# Patient Record
Sex: Female | Born: 1935 | Hispanic: No | Marital: Married
Health system: Southern US, Community
[De-identification: ages and names within clinical notes are randomized; demographics above are authoritative.]

---

## 2004-10-09 ENCOUNTER — Ambulatory Visit: Payer: Self-pay | Admitting: Family Medicine

## 2006-09-13 ENCOUNTER — Ambulatory Visit: Payer: Self-pay | Admitting: Family Medicine

## 2007-03-02 ENCOUNTER — Ambulatory Visit: Payer: Self-pay | Admitting: Urology

## 2007-03-21 ENCOUNTER — Ambulatory Visit: Payer: Self-pay | Admitting: Urology

## 2007-03-21 ENCOUNTER — Other Ambulatory Visit: Payer: Self-pay

## 2007-03-28 ENCOUNTER — Ambulatory Visit: Payer: Self-pay | Admitting: Urology

## 2008-05-29 ENCOUNTER — Ambulatory Visit: Payer: Self-pay | Admitting: Internal Medicine

## 2009-02-18 ENCOUNTER — Inpatient Hospital Stay: Payer: Self-pay | Admitting: Internal Medicine

## 2009-03-17 ENCOUNTER — Ambulatory Visit: Payer: Self-pay | Admitting: Oncology

## 2009-04-17 LAB — MORPHOLOGY - CHCC SATELLITE
PLT EST ~~LOC~~: ADEQUATE
RBC Comments: NORMAL

## 2009-04-17 LAB — CMP (CANCER CENTER ONLY)
ALT(SGPT): 12 U/L (ref 10–47)
Albumin: 3.6 g/dL (ref 3.3–5.5)
CO2: 26 mEq/L (ref 18–33)
Glucose, Bld: 150 mg/dL — ABNORMAL HIGH (ref 73–118)
Potassium: 4 mEq/L (ref 3.3–4.7)
Sodium: 148 mEq/L — ABNORMAL HIGH (ref 128–145)
Total Bilirubin: 0.4 mg/dl (ref 0.20–1.60)
Total Protein: 7.7 g/dL (ref 6.4–8.1)

## 2009-04-17 LAB — CBC WITH DIFFERENTIAL (CANCER CENTER ONLY)
BASO#: 0.2 10*3/uL (ref 0.0–0.2)
EOS%: 5.5 % (ref 0.0–7.0)
Eosinophils Absolute: 0.9 10*3/uL — ABNORMAL HIGH (ref 0.0–0.5)
HGB: 13.5 g/dL (ref 11.6–15.9)
LYMPH%: 37.7 % (ref 14.0–48.0)
MCH: 30.7 pg (ref 26.0–34.0)
MCHC: 34.7 g/dL (ref 32.0–36.0)
MCV: 88 fL (ref 81–101)
MONO%: 5.2 % (ref 0.0–13.0)
NEUT#: 8.4 10*3/uL — ABNORMAL HIGH (ref 1.5–6.5)
RBC: 4.38 10*6/uL (ref 3.70–5.32)

## 2009-04-18 LAB — URINALYSIS, MICROSCOPIC (CHCC SATELLITE)
Bilirubin (Urine): NEGATIVE
Glucose: NEGATIVE g/dL
Ketones: NEGATIVE mg/dL
Specific Gravity, Urine: 1.015 (ref 1.003–1.035)

## 2009-04-24 LAB — PROTEIN ELECTROPHORESIS, SERUM
Alpha-1-Globulin: 4.7 % (ref 2.9–4.9)
Beta 2: 6.3 % (ref 3.2–6.5)
Beta Globulin: 6.2 % (ref 4.7–7.2)
Gamma Globulin: 13.3 % (ref 11.1–18.8)

## 2009-04-24 LAB — FERRITIN: Ferritin: 14 ng/mL (ref 10–291)

## 2009-04-24 LAB — IRON AND TIBC
%SAT: 15 % — ABNORMAL LOW (ref 20–55)
Iron: 48 ug/dL (ref 42–145)

## 2009-05-09 ENCOUNTER — Ambulatory Visit: Payer: Self-pay | Admitting: Oncology

## 2009-05-13 LAB — URINALYSIS, MICROSCOPIC (CHCC SATELLITE)
Protein: NEGATIVE mg/dL
pH: 8 (ref 4.60–8.00)

## 2009-05-13 LAB — CBC WITH DIFFERENTIAL (CANCER CENTER ONLY)
BASO%: 1.2 % (ref 0.0–2.0)
EOS%: 4.9 % (ref 0.0–7.0)
LYMPH%: 45.6 % (ref 14.0–48.0)
MCH: 29.9 pg (ref 26.0–34.0)
MCHC: 34.1 g/dL (ref 32.0–36.0)
MCV: 88 fL (ref 81–101)
MONO%: 3.6 % (ref 0.0–13.0)
NEUT#: 6.2 10*3/uL (ref 1.5–6.5)
Platelets: 473 10*3/uL — ABNORMAL HIGH (ref 145–400)
RBC: 4.31 10*6/uL (ref 3.70–5.32)
RDW: 11.5 % (ref 10.5–14.6)

## 2009-06-02 ENCOUNTER — Ambulatory Visit: Payer: Self-pay | Admitting: Internal Medicine

## 2009-08-20 ENCOUNTER — Ambulatory Visit: Payer: Self-pay | Admitting: Oncology

## 2009-08-21 LAB — CBC WITH DIFFERENTIAL (CANCER CENTER ONLY)
BASO#: 0.2 10*3/uL (ref 0.0–0.2)
EOS%: 4.6 % (ref 0.0–7.0)
Eosinophils Absolute: 0.6 10*3/uL — ABNORMAL HIGH (ref 0.0–0.5)
HCT: 42.3 % (ref 34.8–46.6)
HGB: 14.1 g/dL (ref 11.6–15.9)
LYMPH#: 6 10*3/uL — ABNORMAL HIGH (ref 0.9–3.3)
MCH: 30.6 pg (ref 26.0–34.0)
MCHC: 33.2 g/dL (ref 32.0–36.0)
MONO%: 3.5 % (ref 0.0–13.0)
NEUT%: 46 % (ref 39.6–80.0)
RBC: 4.6 10*6/uL (ref 3.70–5.32)

## 2009-08-21 LAB — IRON AND TIBC
Iron: 110 ug/dL (ref 42–145)
UIBC: 199 ug/dL

## 2009-08-21 LAB — FERRITIN: Ferritin: 34 ng/mL (ref 10–291)

## 2009-11-28 ENCOUNTER — Emergency Department: Payer: Self-pay | Admitting: Emergency Medicine

## 2010-07-16 ENCOUNTER — Emergency Department: Payer: Self-pay | Admitting: Emergency Medicine

## 2010-07-27 ENCOUNTER — Emergency Department: Payer: Self-pay | Admitting: Emergency Medicine

## 2010-09-11 ENCOUNTER — Ambulatory Visit: Payer: Self-pay | Admitting: Oncology

## 2010-09-23 LAB — CBC WITH DIFFERENTIAL (CANCER CENTER ONLY)
BASO%: 1.1 % (ref 0.0–2.0)
EOS%: 4.6 % (ref 0.0–7.0)
LYMPH%: 38.2 % (ref 14.0–48.0)
MCHC: 34.4 g/dL (ref 32.0–36.0)
MCV: 93 fL (ref 81–101)
MONO#: 0.6 10*3/uL (ref 0.1–0.9)
NEUT%: 51.3 % (ref 39.6–80.0)
Platelets: 408 10*3/uL — ABNORMAL HIGH (ref 145–400)
RDW: 12.1 % (ref 10.5–14.6)
WBC: 12.4 10*3/uL — ABNORMAL HIGH (ref 3.9–10.0)

## 2010-09-23 LAB — CMP (CANCER CENTER ONLY)
Albumin: 4 g/dL (ref 3.3–5.5)
BUN, Bld: 15 mg/dL (ref 7–22)
CO2: 29 mEq/L (ref 18–33)
Calcium: 10 mg/dL (ref 8.0–10.3)
Chloride: 101 mEq/L (ref 98–108)
Glucose, Bld: 124 mg/dL — ABNORMAL HIGH (ref 73–118)
Potassium: 4.4 mEq/L (ref 3.3–4.7)
Sodium: 140 mEq/L (ref 128–145)
Total Protein: 7.2 g/dL (ref 6.4–8.1)

## 2010-09-23 LAB — IRON AND TIBC: TIBC: 293 ug/dL (ref 250–470)

## 2010-09-23 LAB — FERRITIN: Ferritin: 62 ng/mL (ref 10–291)

## 2011-01-20 ENCOUNTER — Ambulatory Visit: Payer: Self-pay | Admitting: Internal Medicine

## 2011-02-14 ENCOUNTER — Ambulatory Visit: Payer: Self-pay | Admitting: Internal Medicine

## 2011-11-08 ENCOUNTER — Emergency Department: Payer: Self-pay | Admitting: Unknown Physician Specialty

## 2012-01-24 ENCOUNTER — Ambulatory Visit: Payer: Self-pay | Admitting: Internal Medicine

## 2012-01-24 LAB — CBC CANCER CENTER
Basophil %: 0.4 %
Eosinophil %: 5.2 %
HCT: 40.7 % (ref 35.0–47.0)
Lymphocyte %: 42.6 %
MCHC: 34.5 g/dL (ref 32.0–36.0)
Monocyte %: 5.3 %
Neutrophil #: 6.7 x10 3/mm — ABNORMAL HIGH (ref 1.4–6.5)
RBC: 4.31 10*6/uL (ref 3.80–5.20)
WBC: 14.4 x10 3/mm — ABNORMAL HIGH (ref 3.6–11.0)

## 2012-02-14 ENCOUNTER — Ambulatory Visit: Payer: Self-pay | Admitting: Internal Medicine

## 2012-04-03 ENCOUNTER — Inpatient Hospital Stay: Payer: Self-pay | Admitting: Internal Medicine

## 2012-04-03 LAB — CBC WITH DIFFERENTIAL/PLATELET
Basophil #: 0 10*3/uL (ref 0.0–0.1)
Basophil %: 0.3 %
Eosinophil #: 0.1 10*3/uL (ref 0.0–0.7)
HCT: 39 % (ref 35.0–47.0)
HGB: 12.9 g/dL (ref 12.0–16.0)
Lymphocyte %: 13.8 %
MCH: 31.4 pg (ref 26.0–34.0)
MCHC: 33.1 g/dL (ref 32.0–36.0)
MCV: 95 fL (ref 80–100)
Monocyte #: 1.2 x10 3/mm — ABNORMAL HIGH (ref 0.2–0.9)
Monocyte %: 6.5 %
Neutrophil %: 79 %
Platelet: 458 10*3/uL — ABNORMAL HIGH (ref 150–440)
RBC: 4.11 10*6/uL (ref 3.80–5.20)
RDW: 13.2 % (ref 11.5–14.5)
WBC: 18.4 10*3/uL — ABNORMAL HIGH (ref 3.6–11.0)

## 2012-04-03 LAB — BASIC METABOLIC PANEL
BUN: 15 mg/dL (ref 7–18)
Calcium, Total: 9.3 mg/dL (ref 8.5–10.1)
Chloride: 103 mmol/L (ref 98–107)
EGFR (African American): >60
EGFR (Non-African Amer.): >60
Osmolality: 282 (ref 275–301)

## 2012-04-05 LAB — CBC WITH DIFFERENTIAL/PLATELET
Basophil #: 0.1 10*3/uL (ref 0.0–0.1)
Basophil %: 0.4 %
Eosinophil #: 0.4 10*3/uL (ref 0.0–0.7)
Eosinophil %: 2.7 %
HCT: 37 % (ref 35.0–47.0)
Lymphocyte %: 24.4 %
MCHC: 33.2 g/dL (ref 32.0–36.0)
MCV: 96 fL (ref 80–100)
Monocyte #: 1.1 x10 3/mm — ABNORMAL HIGH (ref 0.2–0.9)
Monocyte %: 6.5 %
Neutrophil #: 10.9 10*3/uL — ABNORMAL HIGH (ref 1.4–6.5)
Platelet: 441 10*3/uL — ABNORMAL HIGH (ref 150–440)
RBC: 3.84 10*6/uL (ref 3.80–5.20)
RDW: 13.2 % (ref 11.5–14.5)

## 2012-04-05 LAB — BASIC METABOLIC PANEL
Anion Gap: 7 (ref 7–16)
Calcium, Total: 9 mg/dL (ref 8.5–10.1)
Chloride: 103 mmol/L (ref 98–107)
Co2: 32 mmol/L (ref 21–32)
EGFR (Non-African Amer.): >60
Sodium: 142 mmol/L (ref 136–145)

## 2012-09-19 ENCOUNTER — Ambulatory Visit: Payer: Self-pay | Admitting: Internal Medicine

## 2012-11-17 ENCOUNTER — Ambulatory Visit: Payer: Self-pay | Admitting: Internal Medicine

## 2013-01-13 ENCOUNTER — Ambulatory Visit: Payer: Self-pay | Admitting: Internal Medicine

## 2013-01-22 LAB — CBC CANCER CENTER
Basophil #: 0.1 x10 3/mm (ref 0.0–0.1)
Eosinophil #: 0.6 x10 3/mm (ref 0.0–0.7)
Eosinophil %: 4.5 %
HCT: 41.2 % (ref 35.0–47.0)
HGB: 13.7 g/dL (ref 12.0–16.0)
Lymphocyte #: 5.4 x10 3/mm — ABNORMAL HIGH (ref 1.0–3.6)
Lymphocyte %: 40.1 %
MCH: 31.2 pg (ref 26.0–34.0)
MCHC: 33.3 g/dL (ref 32.0–36.0)
Monocyte #: 0.8 x10 3/mm (ref 0.2–0.9)
Monocyte %: 6.2 %
Neutrophil #: 6.5 x10 3/mm (ref 1.4–6.5)
Neutrophil %: 48.3 %
RBC: 4.39 10*6/uL (ref 3.80–5.20)

## 2013-02-13 ENCOUNTER — Ambulatory Visit: Payer: Self-pay | Admitting: Internal Medicine

## 2014-01-08 ENCOUNTER — Inpatient Hospital Stay: Payer: Self-pay | Admitting: Internal Medicine

## 2014-01-08 LAB — CBC
HCT: 42.1 % (ref 35.0–47.0)
HGB: 14.2 g/dL (ref 12.0–16.0)
MCH: 31.2 pg (ref 26.0–34.0)
MCHC: 33.6 g/dL (ref 32.0–36.0)
MCV: 93 fL (ref 80–100)
Platelet: 314 10*3/uL (ref 150–440)
RBC: 4.53 10*6/uL (ref 3.80–5.20)
RDW: 13.6 % (ref 11.5–14.5)
WBC: 25.7 10*3/uL — ABNORMAL HIGH (ref 3.6–11.0)

## 2014-01-08 LAB — URINALYSIS, COMPLETE
BLOOD: NEGATIVE
Bilirubin,UR: NEGATIVE
Glucose,UR: NEGATIVE mg/dL (ref 0–75)
Hyaline Cast: 4
NITRITE: NEGATIVE
Ph: 5 (ref 4.5–8.0)
RBC,UR: 1 /HPF (ref 0–5)
Specific Gravity: 1.019 (ref 1.003–1.030)
Squamous Epithelial: 1
WBC UR: 2 /HPF (ref 0–5)

## 2014-01-08 LAB — COMPREHENSIVE METABOLIC PANEL
ANION GAP: 9 (ref 7–16)
AST: 32 U/L (ref 15–37)
Albumin: 3.6 g/dL (ref 3.4–5.0)
Alkaline Phosphatase: 55 U/L
BUN: 41 mg/dL — ABNORMAL HIGH (ref 7–18)
Bilirubin,Total: 0.4 mg/dL (ref 0.2–1.0)
CO2: 23 mmol/L (ref 21–32)
Calcium, Total: 8.2 mg/dL — ABNORMAL LOW (ref 8.5–10.1)
Chloride: 101 mmol/L (ref 98–107)
Creatinine: 2.03 mg/dL — ABNORMAL HIGH (ref 0.60–1.30)
EGFR (Non-African Amer.): 23 — ABNORMAL LOW
GFR CALC AF AMER: 27 — AB
Glucose: 112 mg/dL — ABNORMAL HIGH (ref 65–99)
OSMOLALITY: 277 (ref 275–301)
Potassium: 4 mmol/L (ref 3.5–5.1)
SGPT (ALT): 31 U/L (ref 12–78)
Sodium: 133 mmol/L — ABNORMAL LOW (ref 136–145)
Total Protein: 7.5 g/dL (ref 6.4–8.2)

## 2014-01-08 LAB — PHOSPHORUS: Phosphorus: 2.5 mg/dL (ref 2.5–4.9)

## 2014-01-08 LAB — PROTIME-INR
INR: 1
PROTHROMBIN TIME: 12.6 s (ref 11.5–14.7)

## 2014-01-08 LAB — TROPONIN I: Troponin-I: <0.02 ng/mL

## 2014-01-08 LAB — MAGNESIUM: Magnesium: 1.4 mg/dL — ABNORMAL LOW

## 2014-01-09 LAB — BASIC METABOLIC PANEL
ANION GAP: 7 (ref 7–16)
BUN: 45 mg/dL — AB (ref 7–18)
Calcium, Total: 7.2 mg/dL — ABNORMAL LOW (ref 8.5–10.1)
Chloride: 106 mmol/L (ref 98–107)
Co2: 21 mmol/L (ref 21–32)
Creatinine: 2.44 mg/dL — ABNORMAL HIGH (ref 0.60–1.30)
EGFR (Non-African Amer.): 19 — ABNORMAL LOW
GFR CALC AF AMER: 22 — AB
GLUCOSE: 84 mg/dL (ref 65–99)
Osmolality: 279 (ref 275–301)
Potassium: 4 mmol/L (ref 3.5–5.1)
Sodium: 134 mmol/L — ABNORMAL LOW (ref 136–145)

## 2014-01-09 LAB — CBC WITH DIFFERENTIAL/PLATELET
BASOS ABS: 0 10*3/uL (ref 0.0–0.1)
BASOS PCT: 0.1 %
Eosinophil #: 0 10*3/uL (ref 0.0–0.7)
Eosinophil %: 0.1 %
HCT: 36.1 % (ref 35.0–47.0)
HGB: 12 g/dL (ref 12.0–16.0)
LYMPHS PCT: 5.9 %
Lymphocyte #: 0.9 10*3/uL — ABNORMAL LOW (ref 1.0–3.6)
MCH: 31.2 pg (ref 26.0–34.0)
MCHC: 33.3 g/dL (ref 32.0–36.0)
MCV: 94 fL (ref 80–100)
MONO ABS: 1 x10 3/mm — AB (ref 0.2–0.9)
Monocyte %: 6.1 %
NEUTROS PCT: 87.8 %
Neutrophil #: 14.1 10*3/uL — ABNORMAL HIGH (ref 1.4–6.5)
Platelet: 277 10*3/uL (ref 150–440)
RBC: 3.85 10*6/uL (ref 3.80–5.20)
RDW: 13.8 % (ref 11.5–14.5)
WBC: 16 10*3/uL — ABNORMAL HIGH (ref 3.6–11.0)

## 2014-01-09 LAB — CLOSTRIDIUM DIFFICILE(ARMC)

## 2014-01-09 LAB — MAGNESIUM: Magnesium: 1.9 mg/dL

## 2014-01-10 LAB — BASIC METABOLIC PANEL
Anion Gap: 8 (ref 7–16)
BUN: 33 mg/dL — ABNORMAL HIGH (ref 7–18)
Calcium, Total: 6.7 mg/dL — CL (ref 8.5–10.1)
Chloride: 113 mmol/L — ABNORMAL HIGH (ref 98–107)
Co2: 18 mmol/L — ABNORMAL LOW (ref 21–32)
Creatinine: 1.39 mg/dL — ABNORMAL HIGH (ref 0.60–1.30)
EGFR (African American): 43 — ABNORMAL LOW
GFR CALC NON AF AMER: 37 — AB
Glucose: 69 mg/dL (ref 65–99)
Osmolality: 283 (ref 275–301)
POTASSIUM: 3.8 mmol/L (ref 3.5–5.1)
Sodium: 139 mmol/L (ref 136–145)

## 2014-01-10 LAB — URINE CULTURE

## 2014-01-10 LAB — CBC WITH DIFFERENTIAL/PLATELET
BASOS ABS: 0 10*3/uL (ref 0.0–0.1)
Basophil %: 0.3 %
EOS ABS: 0 10*3/uL (ref 0.0–0.7)
EOS PCT: 0.3 %
HCT: 33.8 % — ABNORMAL LOW (ref 35.0–47.0)
HGB: 11.4 g/dL — ABNORMAL LOW (ref 12.0–16.0)
Lymphocyte #: 2.5 10*3/uL (ref 1.0–3.6)
Lymphocyte %: 20.2 %
MCH: 31.9 pg (ref 26.0–34.0)
MCHC: 33.8 g/dL (ref 32.0–36.0)
MCV: 95 fL (ref 80–100)
Monocyte #: 0.7 x10 3/mm (ref 0.2–0.9)
Monocyte %: 5.8 %
NEUTROS ABS: 9.2 10*3/uL — AB (ref 1.4–6.5)
Neutrophil %: 73.4 %
Platelet: 254 10*3/uL (ref 150–440)
RBC: 3.58 10*6/uL — ABNORMAL LOW (ref 3.80–5.20)
RDW: 13.8 % (ref 11.5–14.5)
WBC: 12.6 10*3/uL — ABNORMAL HIGH (ref 3.6–11.0)

## 2014-01-12 LAB — CREATININE, SERUM: Creatinine: 0.72 mg/dL (ref 0.60–1.30)

## 2014-01-13 LAB — CBC WITH DIFFERENTIAL/PLATELET
Basophil #: 0.1 10*3/uL (ref 0.0–0.1)
Basophil %: 0.4 %
EOS ABS: 0.3 10*3/uL (ref 0.0–0.7)
Eosinophil %: 1.4 %
HCT: 38 % (ref 35.0–47.0)
HGB: 12.9 g/dL (ref 12.0–16.0)
LYMPHS ABS: 4.9 10*3/uL — AB (ref 1.0–3.6)
Lymphocyte %: 26.2 %
MCH: 30.6 pg (ref 26.0–34.0)
MCHC: 33.9 g/dL (ref 32.0–36.0)
MCV: 91 fL (ref 80–100)
MONO ABS: 1.6 x10 3/mm — AB (ref 0.2–0.9)
Monocyte %: 8.6 %
NEUTROS PCT: 63.4 %
Neutrophil #: 11.8 10*3/uL — ABNORMAL HIGH (ref 1.4–6.5)
Platelet: 301 10*3/uL (ref 150–440)
RBC: 4.2 10*6/uL (ref 3.80–5.20)
RDW: 13.4 % (ref 11.5–14.5)
WBC: 18.7 10*3/uL — ABNORMAL HIGH (ref 3.6–11.0)

## 2014-01-13 LAB — BASIC METABOLIC PANEL
ANION GAP: 6 — AB (ref 7–16)
BUN: 7 mg/dL (ref 7–18)
Calcium, Total: 8.7 mg/dL (ref 8.5–10.1)
Chloride: 103 mmol/L (ref 98–107)
Co2: 32 mmol/L (ref 21–32)
Creatinine: 0.71 mg/dL (ref 0.60–1.30)
EGFR (African American): >60
GLUCOSE: 108 mg/dL — AB (ref 65–99)
Osmolality: 280 (ref 275–301)
Potassium: 2.8 mmol/L — ABNORMAL LOW (ref 3.5–5.1)
SODIUM: 141 mmol/L (ref 136–145)

## 2014-01-13 LAB — URINALYSIS, COMPLETE
BACTERIA: NONE SEEN
Bilirubin,UR: NEGATIVE
Glucose,UR: NEGATIVE mg/dL (ref 0–75)
Hyaline Cast: 1
Ketone: NEGATIVE
Leukocyte Esterase: NEGATIVE
Nitrite: NEGATIVE
PH: 7 (ref 4.5–8.0)
Protein: NEGATIVE
RBC,UR: 10 /HPF (ref 0–5)
SPECIFIC GRAVITY: 1.011 (ref 1.003–1.030)
Squamous Epithelial: 4
WBC UR: 2 /HPF (ref 0–5)

## 2014-01-13 LAB — MAGNESIUM: Magnesium: 1.2 mg/dL — ABNORMAL LOW

## 2014-01-14 LAB — CBC WITH DIFFERENTIAL/PLATELET
Basophil #: 0.2 10*3/uL — ABNORMAL HIGH (ref 0.0–0.1)
Basophil %: 0.7 %
EOS PCT: 1.2 %
Eosinophil #: 0.2 10*3/uL (ref 0.0–0.7)
HCT: 34.4 % — ABNORMAL LOW (ref 35.0–47.0)
HGB: 11.7 g/dL — ABNORMAL LOW (ref 12.0–16.0)
LYMPHS ABS: 5.7 10*3/uL — AB (ref 1.0–3.6)
LYMPHS PCT: 28.1 %
MCH: 31.2 pg (ref 26.0–34.0)
MCHC: 33.9 g/dL (ref 32.0–36.0)
MCV: 92 fL (ref 80–100)
Monocyte #: 1.5 x10 3/mm — ABNORMAL HIGH (ref 0.2–0.9)
Monocyte %: 7.6 %
NEUTROS PCT: 62.4 %
Neutrophil #: 12.7 10*3/uL — ABNORMAL HIGH (ref 1.4–6.5)
Platelet: 294 10*3/uL (ref 150–440)
RBC: 3.74 10*6/uL — ABNORMAL LOW (ref 3.80–5.20)
RDW: 13.4 % (ref 11.5–14.5)
WBC: 20.4 10*3/uL — ABNORMAL HIGH (ref 3.6–11.0)

## 2014-01-14 LAB — VANCOMYCIN, TROUGH: VANCOMYCIN, TROUGH: 8 ug/mL — AB (ref 10–20)

## 2014-01-14 LAB — CULTURE, BLOOD (SINGLE)

## 2014-01-14 LAB — BASIC METABOLIC PANEL
Anion Gap: 4 — ABNORMAL LOW (ref 7–16)
BUN: 8 mg/dL (ref 7–18)
CALCIUM: 8.8 mg/dL (ref 8.5–10.1)
CO2: 29 mmol/L (ref 21–32)
CREATININE: 0.69 mg/dL (ref 0.60–1.30)
Chloride: 109 mmol/L — ABNORMAL HIGH (ref 98–107)
EGFR (Non-African Amer.): >60
Glucose: 101 mg/dL — ABNORMAL HIGH (ref 65–99)
OSMOLALITY: 282 (ref 275–301)
POTASSIUM: 3.3 mmol/L — AB (ref 3.5–5.1)
Sodium: 142 mmol/L (ref 136–145)

## 2014-01-14 LAB — MAGNESIUM: Magnesium: 1.8 mg/dL

## 2014-01-14 LAB — CLOSTRIDIUM DIFFICILE(ARMC)

## 2014-01-15 LAB — CBC WITH DIFFERENTIAL/PLATELET
BASOS ABS: 0.1 10*3/uL (ref 0.0–0.1)
Basophil %: 0.6 %
EOS ABS: 0.7 10*3/uL (ref 0.0–0.7)
EOS PCT: 4.3 %
HCT: 36.3 % (ref 35.0–47.0)
HGB: 12.2 g/dL (ref 12.0–16.0)
Lymphocyte #: 3.6 10*3/uL (ref 1.0–3.6)
Lymphocyte %: 23.9 %
MCH: 31 pg (ref 26.0–34.0)
MCHC: 33.6 g/dL (ref 32.0–36.0)
MCV: 93 fL (ref 80–100)
MONO ABS: 0.8 x10 3/mm (ref 0.2–0.9)
MONOS PCT: 5.1 %
NEUTROS ABS: 10 10*3/uL — AB (ref 1.4–6.5)
NEUTROS PCT: 66.1 %
PLATELETS: 372 10*3/uL (ref 150–440)
RBC: 3.92 10*6/uL (ref 3.80–5.20)
RDW: 13.9 % (ref 11.5–14.5)
WBC: 15.2 10*3/uL — ABNORMAL HIGH (ref 3.6–11.0)

## 2014-01-15 LAB — CREATININE, SERUM
Creatinine: 0.83 mg/dL (ref 0.60–1.30)
EGFR (African American): >60
EGFR (Non-African Amer.): >60

## 2014-01-15 LAB — STOOL CULTURE

## 2014-01-16 LAB — CBC WITH DIFFERENTIAL/PLATELET
BASOS PCT: 1 %
Basophil #: 0.2 10*3/uL — ABNORMAL HIGH (ref 0.0–0.1)
Eosinophil #: 0.7 10*3/uL (ref 0.0–0.7)
Eosinophil %: 4.2 %
HCT: 34 % — AB (ref 35.0–47.0)
HGB: 11.5 g/dL — AB (ref 12.0–16.0)
Lymphocyte #: 4.5 10*3/uL — ABNORMAL HIGH (ref 1.0–3.6)
Lymphocyte %: 29.1 %
MCH: 31 pg (ref 26.0–34.0)
MCHC: 33.9 g/dL (ref 32.0–36.0)
MCV: 92 fL (ref 80–100)
MONO ABS: 1 x10 3/mm — AB (ref 0.2–0.9)
Monocyte %: 6.7 %
NEUTROS PCT: 59 %
Neutrophil #: 9.2 10*3/uL — ABNORMAL HIGH (ref 1.4–6.5)
PLATELETS: 378 10*3/uL (ref 150–440)
RBC: 3.71 10*6/uL — ABNORMAL LOW (ref 3.80–5.20)
RDW: 13.8 % (ref 11.5–14.5)
WBC: 15.6 10*3/uL — ABNORMAL HIGH (ref 3.6–11.0)

## 2014-01-16 LAB — BASIC METABOLIC PANEL
ANION GAP: 5 — AB (ref 7–16)
BUN: 15 mg/dL (ref 7–18)
CHLORIDE: 105 mmol/L (ref 98–107)
Calcium, Total: 8.9 mg/dL (ref 8.5–10.1)
Co2: 31 mmol/L (ref 21–32)
Creatinine: 0.76 mg/dL (ref 0.60–1.30)
Glucose: 122 mg/dL — ABNORMAL HIGH (ref 65–99)
Osmolality: 283 (ref 275–301)
Potassium: 3.5 mmol/L (ref 3.5–5.1)
Sodium: 141 mmol/L (ref 136–145)

## 2014-01-16 LAB — CULTURE, BLOOD (SINGLE)

## 2014-02-06 LAB — CULTURE, BLOOD (SINGLE)

## 2014-07-17 IMAGING — CT CT ABD-PELV W/O CM
2 of 4 series · 16 of 46 positions shown, 18 images · non-contrast
Comparison: 03/02/2007

CLINICAL DATA: Diarrhea and abdominal pain.  Nausea.

EXAM:
CT ABDOMEN AND PELVIS WITHOUT CONTRAST
TECHNIQUE: Multidetector CT imaging of the abdomen and pelvis was performed
following the standard protocol without intravenous contrast.

[Series 2: routine abd pel without · axial · non-contrast · 0.76mm/px · z∈[-1089,-674]mm · 13 of 91 slices shown, 15 images]
[im 4/91  soft-tissue]
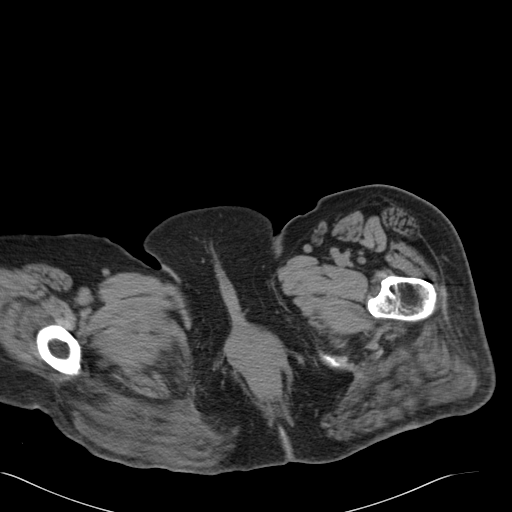
[im 4/91  bone]
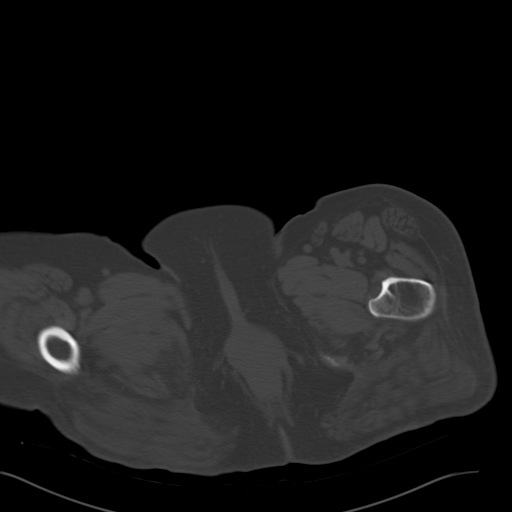
[im 12/91  soft-tissue]
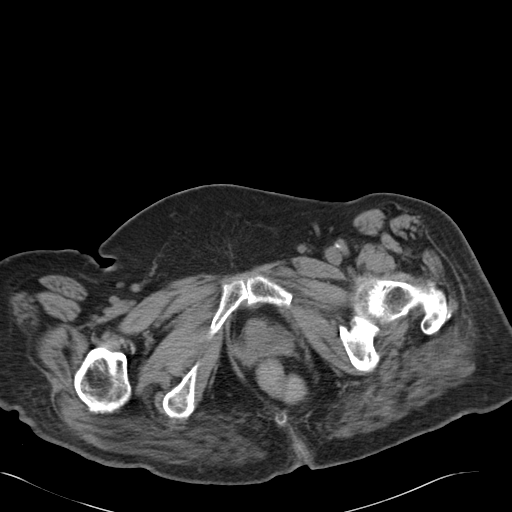
[im 19/91  soft-tissue]
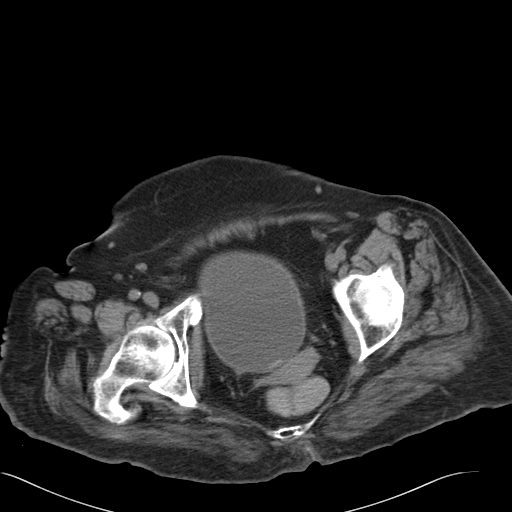
[im 27/91  soft-tissue]
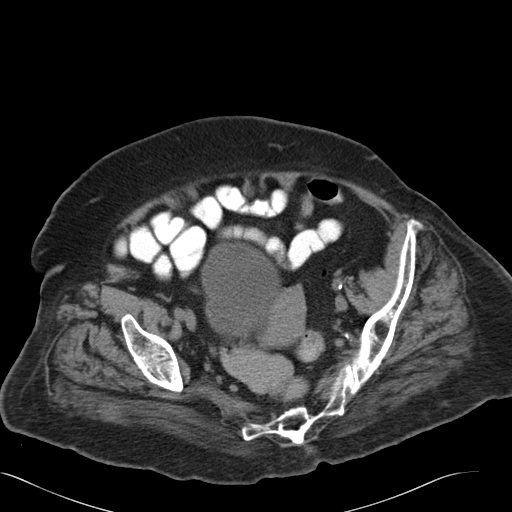
[im 31/91  soft-tissue]
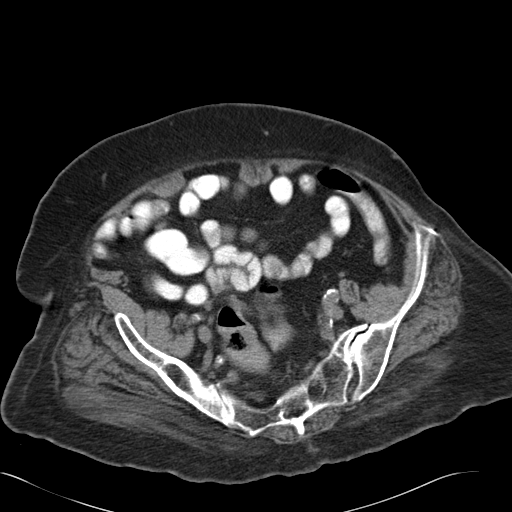
[im 38/91  soft-tissue]
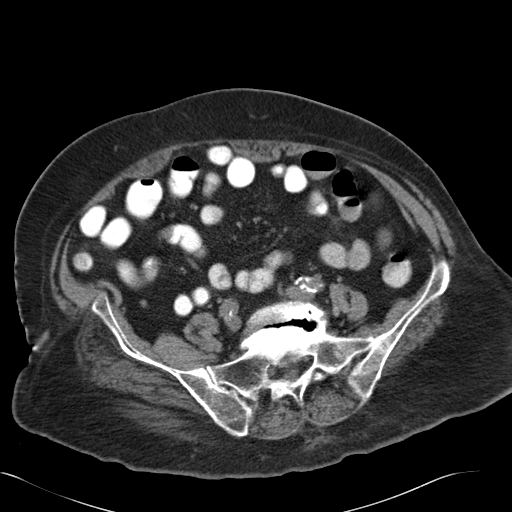
[im 46/91  soft-tissue]
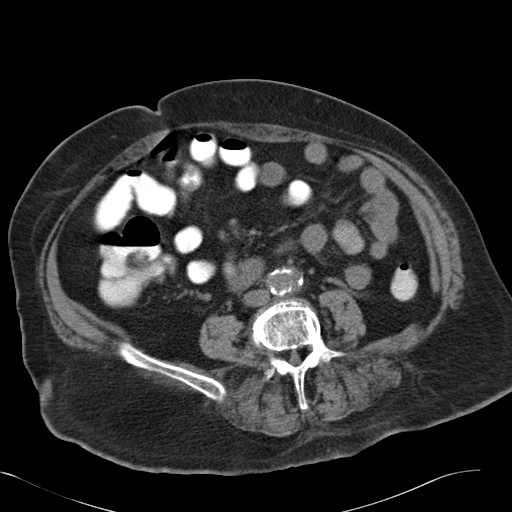
[im 53/91  soft-tissue]
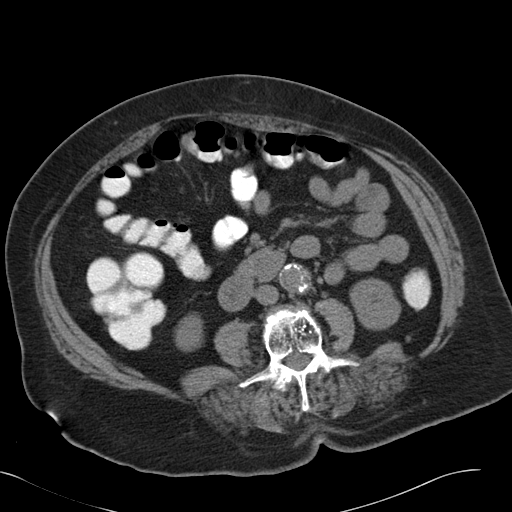
[im 61/91  soft-tissue]
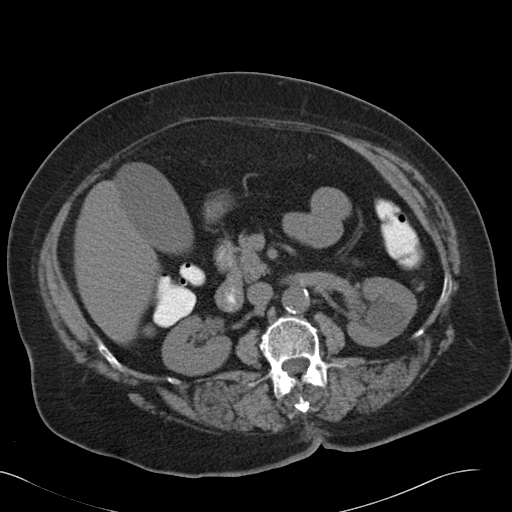
[im 61/91  bone]
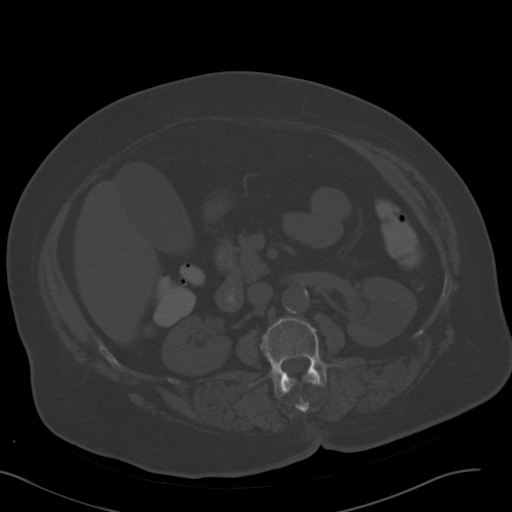
[im 64/91  soft-tissue]
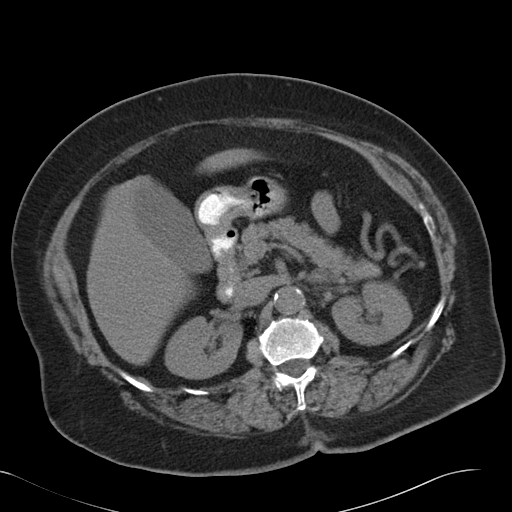
[im 72/91  soft-tissue]
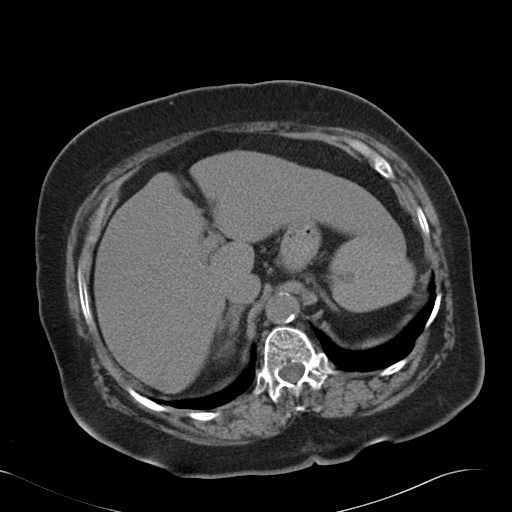
[im 79/91  soft-tissue]
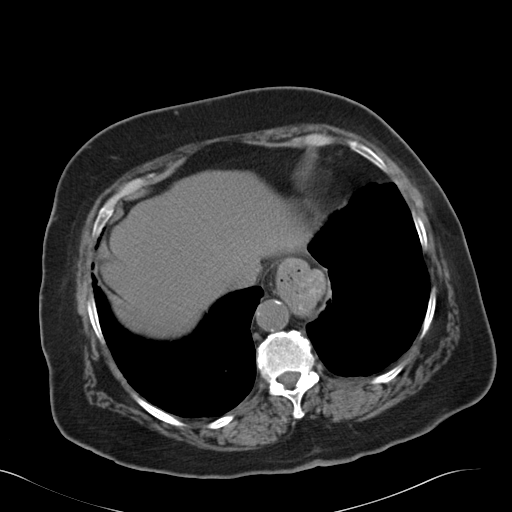
[im 87/91  soft-tissue]
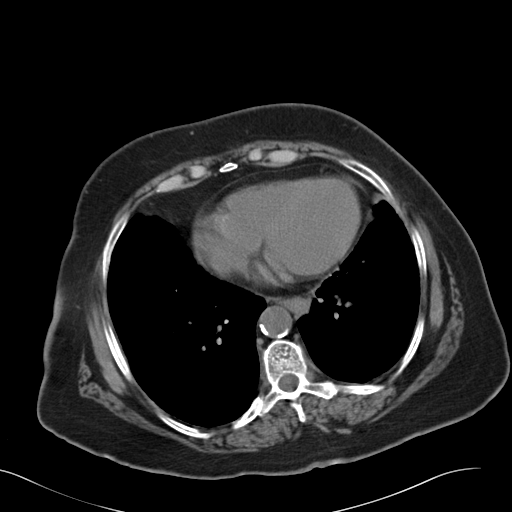

[Series 5: cor routine abd pel wo · coronal · 0.77mm/px · 3 of 140 slices shown]
[im 47/140  soft-tissue]
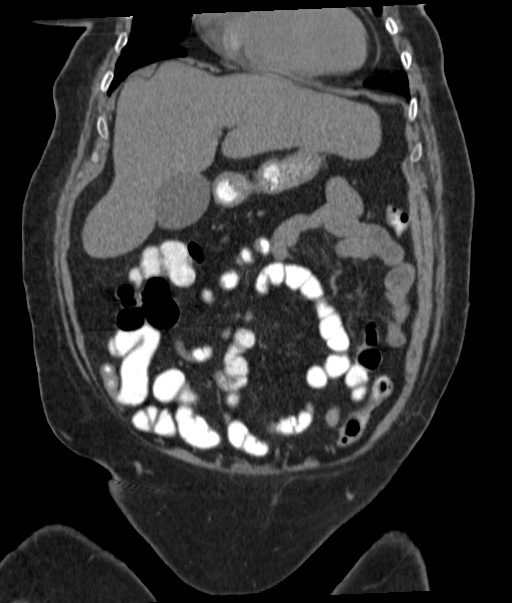
[im 62/140  soft-tissue]
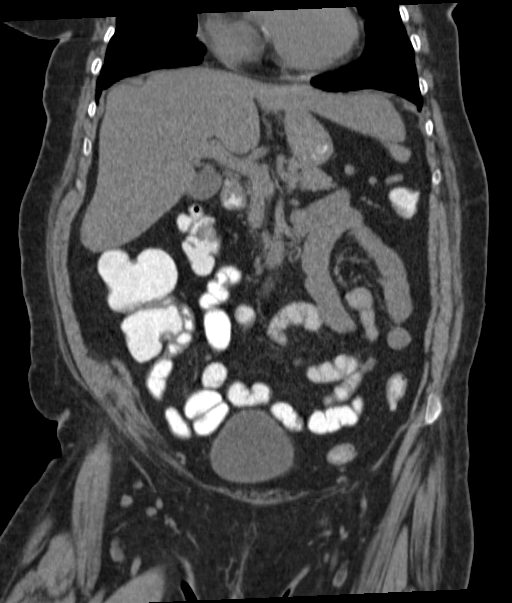
[im 78/140  soft-tissue]
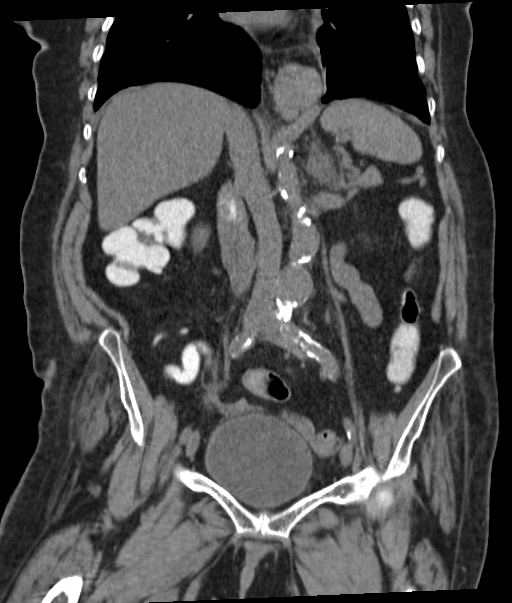

[16 of 46 positions shown; findings below may reference images not displayed]

FINDINGS: Lower Chest: Mild centrilobular emphysema. Minimal subpleural
nodularity in the left lower lobe measures 3 mm on image 1. Not
imaged on the prior. Not well evaluated on the 04/03/2012 chest
study secondary to infection on that exam

Mild cardiomegaly, without pericardial effusion. Trace left pleural
thickening. A small to moderate hiatal hernia

Abdomen/Pelvis: Moderate hepatic steatosis, without focal liver
lesion. Normal spleen, distal stomach, pancreas, gallbladder,
biliary tract. Bilateral adrenal thickening. Interpolar left renal
lesion likely represents a cyst and measures 2.6 cm. Slightly
enlarged since the prior exam. Mild renal cortical thinning
bilaterally.

Non aneurysmal dilatation of the infrarenal aorta at 3.1 cm. 2.7 cm
on the prior. No retroperitoneal or retrocrural adenopathy.
Scattered colonic diverticula. Normal terminal ileum and appendix.
Normal small bowel without abdominal ascites.

No pelvic adenopathy. Pelvic floor laxity. Calcification in the
dependent bladder, including on image 74. Somewhat ill-defined.
Example image 74/series 2 with ill-defined increased density. Normal
uterus, without adnexal mass or significant free pelvic fluid.

Bones/Musculoskeletal: Moderate osteopenia. Remote right tenth rib
fracture. Grade 1 L5-S1 anterolisthesis. Bilateral pars defects at
L5.
IMPRESSION: 1. No acute process or explanation for diarrhea/nausea/abdominal
pain.
2. Hepatic steatosis.
3. Moderate hiatal hernia.
4. Tiny left lower lobe lung nodule. Favor scarring or a subpleural
lymph node. Given risk factors for bronchogenic carcinoma, follow-up
chest CT at 1 year is recommended. This recommendation follows the
consensus statement: "Guidelines for Management of Small Pulmonary
Nodules Detected on CT Scans: A Statement from the [employer]
online at: [URL]
5. Slight increase in the infrarenal aortic dilatation.
6. Subtle calcification in the dependent bladder. This could
represent small stones. Partially calcified bladder lesion cannot be
excluded. Correlate with urinalysis. Consider cystoscopy.
7. Pelvic floor laxity.

## 2014-07-22 IMAGING — CR DG CHEST 2V
1 series · 3 of 3 positions shown · non-contrast
Comparison: DG CHEST 1V PORT dated 01/08/2014

CLINICAL DATA: leukocytosis;

EXAM:
CHEST  2 VIEW

[Series 1: x chest ap · 0.14mm/px · 3 of 3 slices shown]
[im 1/3]
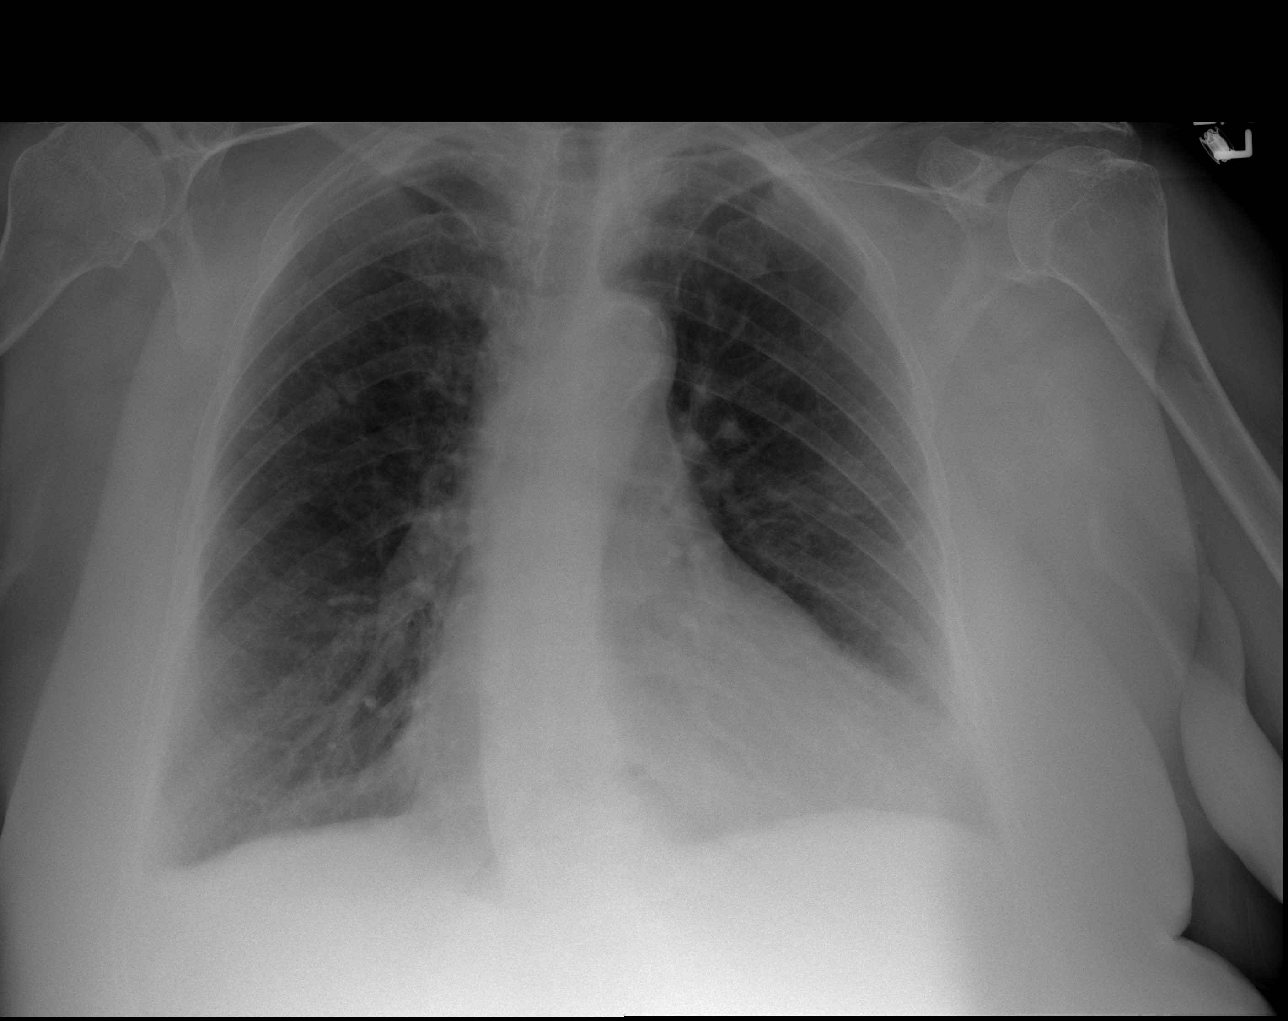
[im 2/3]
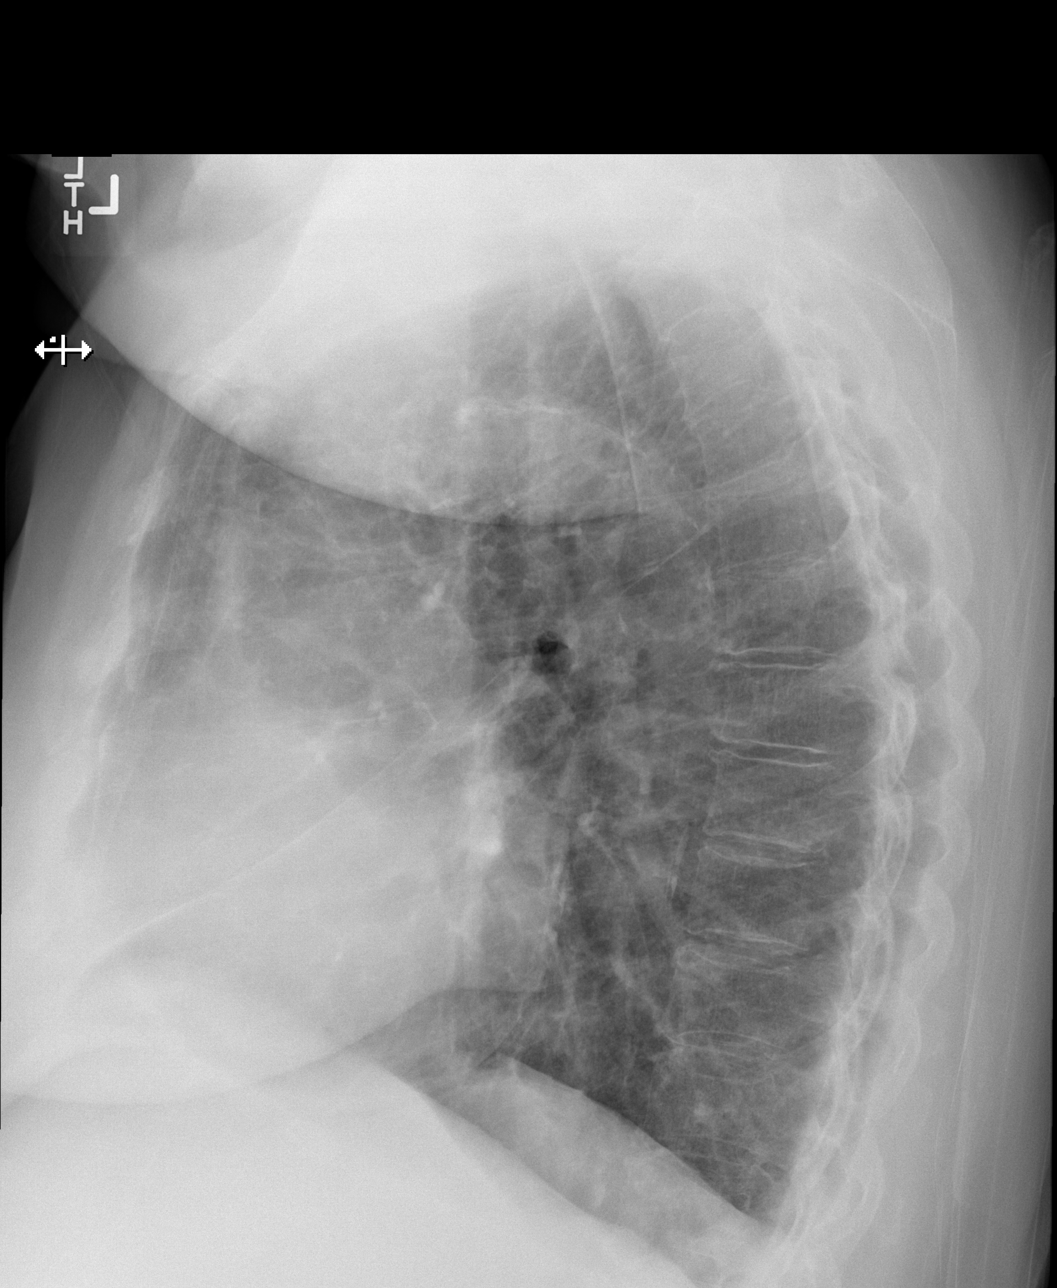
[im 3/3]
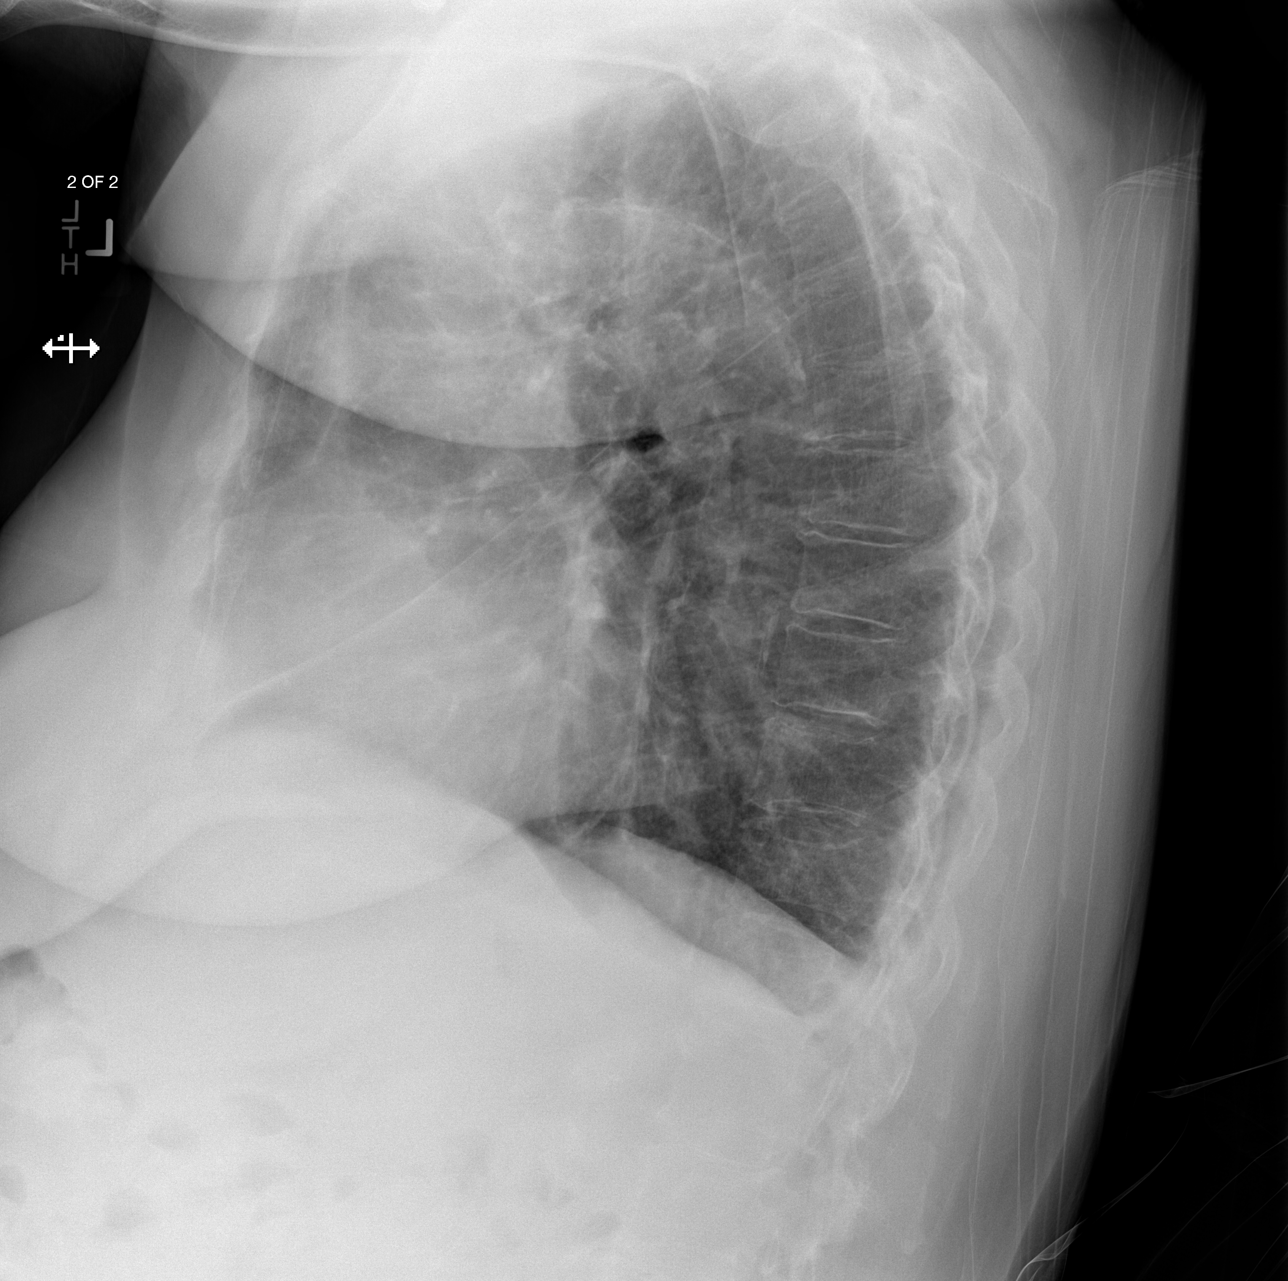

[3 of 3 positions shown; findings below may reference images not displayed]

FINDINGS: The cardiac silhouette is enlarged. There is flattening of the
hemidiaphragms. Atherosclerotic calcifications appreciated within
the aorta. No focal regions consolidation or focal infiltrates. The
bones are osteopenic without evidence of acute osseous
abnormalities. Chronic findings again appreciated involving the
sixth rib on the right.
IMPRESSION: COPD without evidence of acute cardiopulmonary disease.

## 2015-03-08 NOTE — H&P (Signed)
PATIENT NAME:  Megan Boone, Megan Boone MR#:  [id number] DATE OF BIRTH:  [birth year]  DATE OF ADMISSION:  01/08/2014  PRIMARY CARE PHYSICIAN: Dr. Yves DillNeelam Khan   CHIEF COMPLAINT:  Nausea, vomiting, diarrhea.   HISTORY OF PRESENT ILLNESS:  This is a very pleasant 79 year old female who comes from [facility] with nausea, vomiting and diarrhea. She has a past medical history of hypertension, COPD and retinitis. No other sick contacts. Nobody at the assisted living facility is sick.  No travel history.   REVIEW OF SYSTEMS:   CONSTITUTIONAL:  Positive fever, positive chills, positive fatigue, positive weakness. The patient is pretty much bedbound or wheelchair-bound at baseline.  EYES:  She has vision loss.  She is legally blind in both eyes.  EARS, NOSE, THROAT: Positive mild hearing loss. POSITIVE SEASONAL ALLERGIES.   No postnasal drip or snoring.  RESPIRATORY:  No cough, wheezing, hemoptysis. Positive COPD, not on oxygen. CARDIOVASCULAR:  No chest pain, orthopnea, palpations, syncope, edema, arrhythmia.  GASTROINTESTINAL:  Positive nausea, positive vomiting, positive diarrhea. No abdominal pain. No melena or ulcers.   GENITOURINARY: No dysuria or hematuria but she does have medication for chronic UTIs.  ENDOCRINE: No polyuria or polydipsia.  HEMATOLOGIC AND LYMPHATIC: No anemia or easy bruising.  SKIN: No rash or lesions.  MUSCULOSKELETAL:  She has limited activity.  She is wheelchair bound.  NEUROLOGIC:  No evidence of CVA or TIA.  PSYCHIATRIC: No history of anxiety or depression.   PAST MEDICAL HISTORY: 1,  Chronic obstructive pulmonary disease.  2.  Retinitis.  3.  Osteoporosis.  4.  Stress urinary incontinence. 5.  Anxiety.   MEDICATIONS: 1.  Fosamax 70 mg weekly.  2.  Aspirin 81 mg daily.  3.  Celebrex 10 mg daily.  4.  Ferrous sulfate 1 tablet daily.  5.  Fluticasone two sprays daily.  6.  Lisinopril 20 mg daily. 7.  Metoprolol 25 mg daily. 8.  Nexium 40 mg daily.  9.  Nitrofurantoin 50 mg daily. 10.  Zoloft 50 mg daily.  11.  Simvastatin 40 mg daily. 12.  Spiriva 18 mcg daily.   13.  Calcium 600/400 b.i.d.  14.  Fish oil 200 mg p.o. daily.  15.  Sertraline 10 mg daily.  16.  Atenolol 325 two tablets q. 4 hours p.r.n. pain.  17.  ProAir HFA 90 mcg 2 puffs q. 4 hours p.r.n.   PAST SURGICAL HISTORY: None.   SOCIAL HISTORY:  The patient smokes 1/2 pack a day from 1 pack a day. She does not want to quit. She does not want a nicotine patch. The patient was counseled for 3-1/2 minutes. No alcohol or IV drug use. [facility].   ALLERGIES: No known drug allergies.   PHYSICAL EXAMINATION: VITAL SIGNS: Temperature 100.5, pulse 113, respirations 22, blood pressure 122/58, 94% on room air.  GENERAL: The patient is alert, oriented, not in acute distress.  HEENT: Head is atraumatic. Oropharynx is clear. No exudates are noted. She has dry mucous membranes.  NECK: Supple. No JVD, carotid bruit or enlarged thyroid.   CARDIOVASCULAR:  Tachycardia without murmur, gallops, or rubs. PMI is not displaced. LUNGS: Clear to auscultation without crackles, rales, rhonchi or wheezing. Normal chest expansion.   BACK:  No CVA or vertebral tenderness.  ABDOMEN: Bowel sounds are positive. Nontender, nondistended. No hepatosplenomegaly. No rebound or guarding.  EXTREMITIES: No clubbing, cyanosis or edema.  SKIN: Without rash or lesions.  NEUROLOGIC:  She is legally blind. Other cranial  nerves:  No focal abnormalities.  Strength is about a 3 out of 5 in all extremities.   LABORATORY DATA: Sodium 133, potassium 4.0, chloride 101, bicarbonate 23, BUN 41, creatinine 2.03, glucose 112, alkaline phosphatase 55, bilirubin 0.4, ALT 31, AST 32, total protein 7.5, albumin 3.6. Troponin less than 0.02, magnesium 1.4, phosphorus 2.5. INR is 1.0. White blood cells 25.7, hemoglobin 14.2, hematocrit 42.1, platelets 314. Chest x-ray shows atelectasis at the bases.   Lactic acid is 1.0. EKG:  Sinus tachycardia. No ST elevation or depression.   ASSESSMENT AND PLAN:  4.  A 79 year old female who presents with one day of nausea, vomiting, diarrhea and weakness and found to have acute renal failure.  2.  Systemic inflammatory response syndrome.  The patient meets criteria for systemic inflammatory response syndrome with tachycardia, leukocytosis and fever secondary to nausea, vomiting, diarrhea.  3.  Nausea, vomiting, diarrhea, likely viral in etiology. Norovirus is going on around the community. She may also have an bacterial infection with her elevated white blood cells and fever; therefore I have added Flagyl and Levaquin to her antibiotic, taking stool cultures and C. difficile as well.  IV fluids and supportive care.  4.  Tobacco dependence. We encouraged the patient is smoking. She was counseled for 3-1/2 minutes.  5.  Acute renal failure secondary nausea, vomiting, diarrhea. Hold lisinopril, provide IV fluids.  6.  Hypertension. Continue metoprolol. Hold lisinopril.   7.  Chronic obstructive pulmonary disease which seemed to be stable. Continue inhalers.  8.  Hypomagnesium and replete with magnesium supplement and recheck in the a.Boone.  9.  The patient is FULL CODE status. Plan of care was discussed with the power of attorney, Babette Relic [phone] as well as the patient.   TIME SPENT: Approximately 40 minutes.    ____________________________ Janyth Contes. Juliene Pina, MD spm:dp D: 01/08/2014 12:27:41 ET T: 01/08/2014 12:57:52 ET JOB#: [id number]  cc: Unique Searfoss P. Juliene Pina, MD, <Dictator> Janyth Contes Lynford Espinoza MD ELECTRONICALLY SIGNED 01/08/2014 17:45

## 2015-03-08 NOTE — Consult Note (Signed)
PATIENT NAME:  Megan Boone, Norell M MR#:  [id number] DATE OF BIRTH:  January 31, 1936  DATE OF CONSULTATION:  01/11/2014  REFERRING PHYSICIAN:  Dr. Ellsworth Lennoxejan-Sie CONSULTING PHYSICIAN:  Stann Mainlandavid P. Sampson GoonFitzgerald, MD  REASON FOR CONSULTATION:  Bacteremia and diarrhea.   HISTORY OF PRESENT ILLNESS:  This is a very pleasant 79 year old female who lives in [facility].  She was admitted February 24th with acute onset nausea, vomiting and diarrhea.  At that time, she was found to have  fever as well as a white count of 25,000.  She was started on Flagyl and levofloxacin and has clinically improved.  Cultures of her stool testing for Clostridium difficile has been negative.  However blood cultures today turned positive for gram-positive rods.  Vancomycin was added today.   Clinically, she reports feeling much better.  Her bowels have normalized.  She is no longer vomiting.  In fact, she was getting ready for discharge when the blood cultures turned positive.   PAST MEDICAL HISTORY: 1.  COPD.  2.  Retinitis.  3.  Osteoporosis.  4.  Urinary incontinence.  5.  Anxiety.  6.  Hypertension.  7.  Recurrent urinary tract infections.  8.  Depression.  9.  Hyperlipidemia.   PAST SURGICAL HISTORY:  None.   SOCIAL HISTORY:  The patient smokes one half to 1 pack per day.  She does not drink or use drugs.  She resides at [facility].   ALLERGIES:  No known drug allergies.   REVIEW OF SYSTEMS:  Eleven systems reviewed and negative except as per HPI.   PHYSICAL EXAMINATION:  VITAL SIGNS:  On admission, her temperature was 100.5.  However, she has been afebrile with a T-max of 98.4, pulse 95, blood pressure 176/79, sat 92% on room air.  GENERAL:  She is pleasant, interactive.  She does not appear in acute distress.  She is able to sit up on the side of the bed.  HEENT:  Pupils equal, round, reactive to light and accommodation.  Extraocular movements are intact.  Sclerae are anicteric.   Oropharynx, mucous membranes are moist.  There is no thrush.  NECK:  Supple.  HEART:  Somewhat tachy, but regular.  LUNGS:  Clear bilaterally.  ABDOMEN:  Soft, nontender, nondistended.  No hepatosplenomegaly.  EXTREMITIES:  Trace bilateral lower extremity edema.  No skin rash.  JOINTS:  Without any obvious swelling or tenderness.   LABORATORY DATA:  C. diff is negative on admission.  Comprehensive stool culture was negative.  White blood count on admission was 25, it is down to 12.6.  Creatinine is currently 1.37.  Blood cultures, two out of two on the 24th are growing gram-positive rods.  CT scan of the abdomen was unrevealing for any colitis or intra-abdominal abscesses.   IMPRESSION:  A 79 year old admitted with nausea, vomiting, diarrhea and marked elevated white count.  Clinically, she has improved, but blood cultures are growing gram-positive rods.   I suspect she may have had transient bacteremia associated with the colitis.  Whether it was the cause of the colitis or whether gastrointestinal illness was just a virus is no clear.  She does clinically seem to be improving with the levofloxacin and Flagyl.  Vancomycin was added today until further identification recommended.   RECOMMENDATIONS:   1.  Continue vancomycin, Flagyl and Levaquin.  This should provide broad coverage.  Should also provide anaerobic coverage.  2.  Depending on the results of the final culture, further recommendations will be made.  At this point, I suspect she will be able to continue just the levofloxacin and Flagyl likely for a 10 day course total.  3.  Blood cultures have been repeated and are pending.   Thank you for the consult.  I will be off for the next four days, but you can call me [phone], with questions.    ____________________________ Stann Mainland. Sampson Goon, MD dpf:ea D: 01/11/2014 20:12:37 ET T: 01/11/2014 21:12:57 ET JOB#: [id number]  cc: Stann Mainland. Sampson Goon, MD, <Dictator> Joelys Staubs Sampson Goon  MD ELECTRONICALLY SIGNED 01/21/2014 19:01

## 2015-03-08 NOTE — Discharge Summary (Signed)
PATIENT NAME:  Megan Boone, Megan Boone MR#:  [id number] DATE OF BIRTH:  [birth year]  DATE OF ADMISSION:  01/08/2014 DATE OF DISCHARGE:  01/16/2014  ADMITTING PHYSICIAN: Sital P. Juliene PinaMody, MD  DISCHARGING PHYSICIAN:  Silas FloodSheikh A. Ellsworth Lennoxejan-Sie, MD  DISCHARGE DIAGNOSES:  Gastroenteritis, acute kidney injury, colitis, leukocytosis.   IMAGING: CT scan of the abdomen.   CONSULTATIONS:  Infectious disease, Stann MainlandDavid P. Sampson GoonFitzgerald, MD.   HOSPITAL COURSE: This lady was admitted through the Emergency Room, presenting with a history of nausea, vomiting, diarrhea. She also exhibited tachycardia with signs of sepsis including  marked leukocytosis at presentation concerning for systemic inflammatory response syndrome. She was admitted to the medical floor. She was treated with high-flow rate intravenous fluids and blood pressure improved. Her tachycardia also resolved. The patient'Megan Boone diarrhea persisted for a few days before subsiding. Given patient'Megan Boone persistent leukocytosis, I ordered  a CT scan of the abdomen which was negative for colitis. The patient continued to improve symptomatically, however, her white cell count remained persistently elevated. I ordered an infectious disease consultation with Dr. Sampson GoonFitzgerald, who recommended discontinuation of vancomycin that she had been placed on and intravenous Levaquin and Flagyl. The patient had been placed on vancomycin due to blood culture growth of Staphylococcus. Subsequent cultures were negative, which suggested a bacterial contaminant. The patient'Megan Boone white cell count gradually started trending down from 23,000 to 15,000 and she was discharged to home in satisfactory condition. The patient'Megan Boone acute kidney injury resolved quite promptly with intravenous fluids and the remainder of her hospital stay was otherwise uncomplicated.   DISCHARGE INSTRUCTIONS:   1.  Diet: Low sodium.  2.  Activity: As tolerated.  3.  Followup: In 1 to 2 weeks with her primary care physician, Dr. Yves DillNeelam Khan.  4.   Disposition: To home.  5.  Discharge medications: Please refer to medical reconciliation.   DISCHARGE PROCESS TIME SPENT: 35 minutes.    ____________________________ Silas FloodSheikh A. Ellsworth Lennoxejan-Sie, MD sat:cs D: 02/05/2014 13:35:45 ET T: 02/05/2014 15:04:33 ET JOB#: [id number]  cc: Marland McalpineSheikh A. Ellsworth Lennoxejan-Sie, MD, <Dictator> Charlesetta GaribaldiSHEIKH A TEJAN-SIE MD ELECTRONICALLY SIGNED 02/09/2014 15:23

## 2015-03-09 NOTE — H&P (Signed)
PATIENT NAME:  Megan Boone, Megan Boone MR#:  [id number] DATE OF BIRTH:  04-Dec-1935  DATE OF ADMISSION:  04/03/2012  ADDENDUM TO ASSESSMENT/PLAN: The patient has a smoking history. I have counseled her on smoking cessation. She does want a patch. The patient was counseled for up to three minutes.  ____________________________ Janyth ContesSital P. Juliene PinaMody, MD spm:cbb D: 04/03/2012 17:41:53 ET T: 04/03/2012 18:44:36 ET JOB#: [id number]  cc: Cebert Dettmann P. Juliene PinaMody, MD, <Dictator> Malva CoganMarty Moore, MD  Janyth ContesSITAL P Taray Normoyle MD ELECTRONICALLY SIGNED 04/03/2012 19:21

## 2015-03-09 NOTE — H&P (Signed)
PATIENT NAME:  Megan Boone, Megan Boone MR#:  [id number] DATE OF BIRTH:  [birth year]  DATE OF ADMISSION:  04/03/2012  PRIMARY CARE PHYSICIAN: Malva CoganMarty Moore, MD   CHIEF COMPLAINT: Shortness of breath, hypoxia.   HISTORY OF PRESENT ILLNESS: The patient is a 79 year old female who was sent as a direct from her primary care physician's office a chief complaint of hypoxia. At that time her saturations were 86% on room air. She does not normally wear oxygen. She says that she has been having cold-like symptoms over the past week. She denies any wheezing, increasing respiratory rate. She does have cough with clear sputum. No shortness of breath, dyspnea on exertion. She is wheelchair-bound. No travel history, no sick contacts. Sharl MaMarty suspected chronic obstructive pulmonary disease, although the patient does not have a formal diagnosis of chronic obstructive pulmonary disease.   REVIEW OF SYSTEMS: CONSTITUTIONAL: No fever or fatigue. EYES: Positive blurred vision. No glaucoma or cataracts. ENT: No ear pain. Positive hearing loss. RESPIRATORY: Positive cough. No wheezing. No hemoptysis. No dyspnea. No formal diagnosis of chronic obstructive pulmonary disease. CARDIOVASCULAR: No chest pain, orthopnea, palpitations, syncope. GASTROINTESTINAL: No nausea, vomiting, diarrhea, abdominal pain, melena, or ulcers. GENITOURINARY: No dysuria or hematuria. ENDOCRINE: No polyuria or polydipsia. HEMATOLOGIC/LYMPHATIC: No anemia or easy bruising. SKIN: No rash or lesion. MUSCULOSKELETAL: She does have limited activity. She is in a wheelchair. NEUROLOGICAL: No history of cerebrovascular accident, transient ischemic attack, or ataxia. PSYCHIATRIC: No anxiety or depression.   PAST MEDICAL HISTORY:  1. Retinitis pigmentosa.  2. Osteoporosis.  3. Stress urinary incontinence.  4. Anxiety.  5. Hypertension.   MEDICATIONS:  1. Metoprolol 25 mg daily.  2. Fish oil 1200 mg daily.  3. Ferrous sulfate 1 tablet daily.  4. Fluticasone 50 mcg  b.i.d.  5. Cetirizine 10 mg daily.  6. Nitrofurantoin 50 mg daily.  7. Nexium 40 mg daily.  8. Lisinopril 20 mg daily.  9. Simvastatin 40 mg daily.  10. Aspirin 81 mg daily.  11. Celebrex 100 mg daily.  12. Zoloft 50 mg daily.  13. Alendronate 70 mg every Tuesday.  14. Calcium 600 with vitamin D, 1 tablet b.i.d.   PAST SURGICAL HISTORY: Facial surgery.  ALLERGIES: Penicillin causes shock.   SOCIAL HISTORY: The patient smokes one-half pack a day. No alcohol or IV drug use. [facility] lives in [facility].   FAMILY HISTORY: Hypertension and coronary artery disease.   PHYSICAL EXAMINATION:  VITAL SIGNS: Temperature 98.5, pulse is 109, respirations 18, blood pressure 120/66, 98% on room air.   GENERAL: The patient is alert, oriented, not in acute distress.   HEENT: Head is atraumatic. Pupils are round and reactive. Sclerae are anicteric. Mucous membranes are moist. Oropharynx is clear.   NECK: Supple without jugular venous distention, carotid bruit, enlarged thyroid.   HEART: Tachycardia. No murmurs, gallops, or rubs. PMI is nondisplaced.   LUNGS: Clear to auscultation without crackles, rales, rhonchi, or wheezing. Good air flow throughout the lung fields.    BACK: No costovertebral angle tenderness or vertebral tenderness.   ABDOMEN: Bowel sounds are positive, nontender, nondistended. No hepatosplenomegaly.   EXTREMITIES: No clubbing, cyanosis, or edema. Cranial nerves II through XII are grossly intact. There are no focal deficits.   LABORATORY, DIAGNOSTIC AND RADIOLOGICAL DATA: Laboratories are pending.   ASSESSMENT AND PLAN: The patient is a 79 year old female directly admitted from PCPs office with hypoxia. Initial thought was this could be due to chronic obstructive pulmonary disease exacerbation. However, on my clinical exam the patient's  lungs actually sound very clear.   1. Hypoxia: The patient could have some chronic obstructive pulmonary disease, and it could  be that the treatment that she received in her primary care physician's office helped with her lung sounds. On her clinical exam, she has no wheezing suggestive of chronic obstructive pulmonary disease. Due to her hypoxia and her bedbound state, I will go ahead and order a CT scan of the chest to rule out a pulmonary emboli. We will also treat for presumed chronic obstructive pulmonary disease. She will need outpatient PFTs if this needs to be pursued. I will treat with steroids and antibiotics, including azithromycin (Z-Pak).  2. Hypertension: We will continue outpatient medications.  3. Anxiety: Continue sertraline.  4. Stress urinary incontinence: Continue Macrobid.   CODE STATUS: The patient is DO NOT RESUSCITATE.    TIME SPENT: Approximately 55 minutes.   ____________________________ Janyth Contes. Juliene Pina, MD spm:cbb D: 04/03/2012 17:40:05 ET T: 04/03/2012 18:29:12 ET JOB#: [id number]  cc: Alick Lecomte P. Juliene Pina, MD, <Dictator> Malva Cogan, MD  Janyth Contes Ovida Delagarza MD ELECTRONICALLY SIGNED 04/03/2012 19:21

## 2015-03-09 NOTE — Discharge Summary (Signed)
PATIENT NAME:  Megan Boone, Megan Boone MR#:  [id number] DATE OF BIRTH:  [birth year]  DATE OF ADMISSION:  04/03/2012 DATE OF DISCHARGE:  04/05/2012  DISCHARGE DIAGNOSES:  1. Pneumonia.  2. Respiratory failure.  3. Chronic obstructive pulmonary disease.   DISCHARGE MEDICATIONS: The patient is to resume all home medications. Please see discharge instructions for details in this regard. Additionally, the patient is to take levofloxacin, 750 mg, 1 tablet by mouth once a day for four more days. Additionally, the patient will begin receiving home oxygen at a starting delivery rate of 2 liters, and her oxygen will be titrated as needed to maintain oxygen saturations within an appropriate range.   HOSPITAL COURSE: This patient is 79 year old Caucasian female who was sent to [facility] on the date of admission for direct admission from her primary care provider's office [facility]) with hypoxia - her oxygen saturations were 86% and did not improve after treatment with DuoNeb. Thus, she was sent to [facility] for further evaluation and admission. Please see History and Physical examination from date of admission for full details regarding her initial evaluation and treatment. Chest CT for pulmonary embolism was obtained by the admitting hospitalist, and the patient was found to have bilateral pulmonary infiltrates, most likely infectious in nature. Chest CT was negative for embolism. Thus, the patient was started on levofloxacin. She was also started on 2 liters of oxygen via nasal cannula to maintain oxygen saturations in an appropriate range. The patient is a long-term smoker, but did not seem to have a chronic obstructive pulmonary disease exacerbation, so treatments for this were held. She was counseled extensively by all involved in her care on the importance of smoking cessation. The patient said she will quit upon discharge. She did not want anything to help  her quit.   With administration of the aforementioned oxygen and levofloxacin therapy, the patient did show a rapid response. She reported feeling a great deal better as compared to her date of admission, but she remained dependent on supplemental oxygen therapy to maintain her oxygen saturations. Thus, the patient was discharged on home oxygen therapy.   On the date of discharge, the patient did report feeling "a whole lot better" compared to the date of her admission, and she was eager to return home. She was in satisfactory condition for discharge from the hospital and was discharged from the hospital on p.o. levofloxacin therapy to finish the course of her antimicrobial therapy, and she was also discharged on home oxygen therapy. She will maintain close followup with [facility] as an outpatient.   Overall, this patient's hospital course was uncomplicated, and she was discharged from the hospital in satisfactory condition.   DIET: Low salt, low fat diet.   ACTIVITY: As tolerated.   FOLLOWUP: Follow-up visit with Megan MastersMartin Adelynne Joerger, PA-C, MSPAS of [facility] within 1 to 2 weeks of discharge. Follow up sooner if needed.   TIME SPENT: Discharge time spent including counseling, coordination of patient care, and the like was approximately 30 minutes.     ____________________________ Burnett HarryMartin G. Shaune SpittleMayer, PA-C, MSPAS, dictating on behalf of Dr. Ellsworth Lennoxejan-Sie. mgm:bjt D: 04/05/2012 15:55:53 ET T: 04/06/2012 14:59:03 ET JOB#: [id number]  cc: Burnett HarryMartin G. Stacie AcresMayer, PA, <Dictator> Megan DuvalMARTIN G Kaspian Muccio PA ELECTRONICALLY SIGNED 04/10/2012 10:44 Charlesetta GaribaldiSHEIKH A TEJAN-SIE MD ELECTRONICALLY SIGNED 04/11/2012 13:19

## 2019-03-16 DEATH — deceased
